# Patient Record
Sex: Female | Born: 1990 | Race: White | Hispanic: No | Marital: Single | Smoking: Never smoker
Health system: Southern US, Community
[De-identification: ages and names within clinical notes are randomized; demographics above are authoritative.]

## PROBLEM LIST (undated history)

## (undated) DIAGNOSIS — R011 Cardiac murmur, unspecified: Secondary | ICD-10-CM

## (undated) DIAGNOSIS — R112 Nausea with vomiting, unspecified: Secondary | ICD-10-CM

## (undated) DIAGNOSIS — Z889 Allergy status to unspecified drugs, medicaments and biological substances status: Secondary | ICD-10-CM

## (undated) DIAGNOSIS — E039 Hypothyroidism, unspecified: Secondary | ICD-10-CM

## (undated) DIAGNOSIS — K8020 Calculus of gallbladder without cholecystitis without obstruction: Secondary | ICD-10-CM

## (undated) DIAGNOSIS — E079 Disorder of thyroid, unspecified: Secondary | ICD-10-CM

## (undated) DIAGNOSIS — Z9889 Other specified postprocedural states: Secondary | ICD-10-CM

## (undated) HISTORY — DX: Cardiac murmur, unspecified: R01.1

## (undated) HISTORY — PX: SHOULDER SURGERY: SHX246

## (undated) HISTORY — PX: WISDOM TOOTH EXTRACTION: SHX21

## (undated) HISTORY — DX: Disorder of thyroid, unspecified: E07.9

## (undated) HISTORY — PX: TONSILLECTOMY: SUR1361

---

## 2013-07-26 ENCOUNTER — Ambulatory Visit (INDEPENDENT_AMBULATORY_CARE_PROVIDER_SITE_OTHER): Payer: BC Managed Care – PPO | Admitting: Family Medicine

## 2013-07-26 ENCOUNTER — Ambulatory Visit: Payer: BC Managed Care – PPO

## 2013-07-26 VITALS — BP 122/80 | HR 75 | Temp 98.7°F | Resp 16 | Ht 68.0 in | Wt 264.0 lb

## 2013-07-26 DIAGNOSIS — M25542 Pain in joints of left hand: Secondary | ICD-10-CM

## 2013-07-26 DIAGNOSIS — M25541 Pain in joints of right hand: Secondary | ICD-10-CM

## 2013-07-26 DIAGNOSIS — M25549 Pain in joints of unspecified hand: Secondary | ICD-10-CM

## 2013-07-26 DIAGNOSIS — M25579 Pain in unspecified ankle and joints of unspecified foot: Secondary | ICD-10-CM

## 2013-07-26 DIAGNOSIS — M25572 Pain in left ankle and joints of left foot: Secondary | ICD-10-CM

## 2013-07-26 LAB — POCT SEDIMENTATION RATE: POCT SED RATE: 37 mm/hr — AB (ref 0–22)

## 2013-07-26 LAB — C-REACTIVE PROTEIN: CRP: 0.8 mg/dL — ABNORMAL HIGH (ref <0.60)

## 2013-07-26 LAB — RHEUMATOID FACTOR: Rhuematoid fact SerPl-aCnc: <10 IU/mL (ref <=14)

## 2013-07-26 NOTE — Patient Instructions (Signed)
Your x-rays look fine.  I will be in touch with your blood work as soon as it comes in.

## 2013-07-26 NOTE — Progress Notes (Signed)
[facility] [address], [place] [address] [phone]  Date:  07/26/2013   Name:  Michaela Rogers   DOB:  May 04, 1991   MRN:  [id number]  PCP:  No PCP Per Patient    Chief Complaint: Joint Pain   History of Present Illness:  Michaela Rogers is a 22 y.o. very pleasant female patient who presents with the following:  She has had joint pains for some time. She seemed to have pains from her pre- teen to teen years and still today.   Her chiropractor and her ortho (Dr. Chancy Milroy) suggested that she have BW to screen for RA.  She has been dx with hypothyroidism in the past.   Her mother has Hashimotos' thyroididis, and a great grandmother had RA.  Thyroid issues, DM, and lupus run in her family.   She notes pain in her fingers, wrists, and ankles.  Her ankles swell at times.  Her knees can hurt.   She did have a shoulder operation (right) in April of this year.   She has an endocrinologist who checked her TSH recently.   LMP was recent.    There are no active problems to display for this patient.   Past Medical History  Diagnosis Date  . Heart murmur   . Thyroid disease     History reviewed. No pertinent past surgical history.  History  Substance Use Topics  . Smoking status: Never Smoker   . Smokeless tobacco: Not on file  . Alcohol Use: No     Comment: Social    Family History  Problem Relation Age of Onset  . Diabetes Mother   . Hyperlipidemia Father   . Mental illness Brother   . Hypertension Maternal Grandmother   . Stroke Maternal Grandmother   . Diabetes Paternal Grandmother     Allergies  Allergen Reactions  . Codeine Nausea Only    Bad stomach pain    Medication list has been reviewed and updated.  No current outpatient prescriptions on file prior to visit.   No current facility-administered medications on file prior to visit.    Review of Systems:  As per HPI- otherwise negative.  Physical  Examination: Filed Vitals:   07/26/13 1003  BP: 122/80  Pulse: 75  Temp: 98.7 F (37.1 C)  Resp: 16   Filed Vitals:   07/26/13 1003  Height: 5\' 8"  (1.727 m)  Weight: 264 lb (119.75 kg)   Body mass index is 40.15 kg/(m^2). Ideal Body Weight: Weight in (lb) to have BMI = 25: 164.1  GEN: WDWN, NAD, Non-toxic, A & O x 3, obese HEENT: Atraumatic, Normocephalic. Neck supple. No masses, No LAD. Ears and Nose: No external deformity. CV: RRR, No M/G/R. No JVD. No thrill. No extra heart sounds. PULM: CTA B, no wheezes, crackles, rhonchi. No retractions. No resp. distress. No accessory muscle use. ABD: S, NT, ND, +BS. No rebound. No HSM. EXTR: No c/c/e NEURO Normal gait.  PSYCH: Normally interactive. Conversant. Not depressed or anxious appearing.  Calm demeanor.  No apparent nodules or swelling in joints of bilateral hands/ wrists.  Normal ROM of these joints   [facility] reading (PRIMARY) by  Dr. Patsy Lager. Right hand: negative Left hand: negative  Results for orders placed in visit on 07/26/13  POCT SEDIMENTATION RATE      Result Value Range   POCT SED RATE 37 (*) 0 - 22 mm/hr    Assessment and Plan: Joint pain in fingers  of left hand - Plan: DG Hand 2 View Left, DG Hand 2 View Right, POCT SEDIMENTATION RATE, C-reactive protein, Rheumatoid factor, Cyclic Citrul Peptide Antibody, IGG  Pain in joint, ankle and foot, left  Joint pain in fingers of right hand - Plan: DG Hand 2 View Left, DG Hand 2 View Right, POCT SEDIMENTATION RATE, C-reactive protein, Rheumatoid factor, Cyclic Citrul Peptide Antibody, IGG  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  Await other labs.    Signed Abbe Amsterdam, MD

## 2013-07-27 LAB — CYCLIC CITRUL PEPTIDE ANTIBODY, IGG: Cyclic Citrullin Peptide Ab: <2 U/mL (ref 0.0–5.0)

## 2013-07-29 ENCOUNTER — Encounter: Payer: Self-pay | Admitting: Family Medicine

## 2013-11-17 ENCOUNTER — Encounter (HOSPITAL_COMMUNITY): Payer: Self-pay | Admitting: Emergency Medicine

## 2013-11-17 ENCOUNTER — Emergency Department (HOSPITAL_COMMUNITY)
Admission: EM | Admit: 2013-11-17 | Discharge: 2013-11-18 | Disposition: A | Discharge status: Home / Self Care | Payer: BC Managed Care – PPO | Attending: Emergency Medicine | Admitting: Emergency Medicine

## 2013-11-17 DIAGNOSIS — E079 Disorder of thyroid, unspecified: Secondary | ICD-10-CM | POA: Insufficient documentation

## 2013-11-17 DIAGNOSIS — R112 Nausea with vomiting, unspecified: Secondary | ICD-10-CM | POA: Insufficient documentation

## 2013-11-17 DIAGNOSIS — Z3202 Encounter for pregnancy test, result negative: Secondary | ICD-10-CM | POA: Insufficient documentation

## 2013-11-17 DIAGNOSIS — R630 Anorexia: Secondary | ICD-10-CM | POA: Insufficient documentation

## 2013-11-17 DIAGNOSIS — R011 Cardiac murmur, unspecified: Secondary | ICD-10-CM | POA: Insufficient documentation

## 2013-11-17 DIAGNOSIS — K8020 Calculus of gallbladder without cholecystitis without obstruction: Secondary | ICD-10-CM

## 2013-11-17 DIAGNOSIS — R1084 Generalized abdominal pain: Secondary | ICD-10-CM | POA: Insufficient documentation

## 2013-11-17 DIAGNOSIS — Z79899 Other long term (current) drug therapy: Secondary | ICD-10-CM | POA: Insufficient documentation

## 2013-11-17 LAB — URINALYSIS, ROUTINE W REFLEX MICROSCOPIC
Bilirubin Urine: NEGATIVE
Glucose, UA: NEGATIVE mg/dL
Hgb urine dipstick: NEGATIVE
Ketones, ur: NEGATIVE mg/dL
NITRITE: NEGATIVE
Protein, ur: NEGATIVE mg/dL
SPECIFIC GRAVITY, URINE: 1.022 (ref 1.005–1.030)
UROBILINOGEN UA: 0.2 mg/dL (ref 0.0–1.0)
pH: 5.5 (ref 5.0–8.0)

## 2013-11-17 LAB — COMPREHENSIVE METABOLIC PANEL
ALT: 38 U/L — ABNORMAL HIGH (ref 0–35)
AST: 23 U/L (ref 0–37)
Albumin: 4.3 g/dL (ref 3.5–5.2)
Alkaline Phosphatase: 66 U/L (ref 39–117)
BILIRUBIN TOTAL: 1 mg/dL (ref 0.3–1.2)
BUN: 15 mg/dL (ref 6–23)
CHLORIDE: 103 meq/L (ref 96–112)
CO2: 24 mEq/L (ref 19–32)
CREATININE: 0.89 mg/dL (ref 0.50–1.10)
Calcium: 9.6 mg/dL (ref 8.4–10.5)
GFR calc Af Amer: >90 mL/min (ref >90)
GFR calc non Af Amer: 90 mL/min — ABNORMAL LOW (ref >90)
Glucose, Bld: 96 mg/dL (ref 70–99)
Potassium: 3.7 mEq/L (ref 3.7–5.3)
Sodium: 140 mEq/L (ref 137–147)
Total Protein: 7.8 g/dL (ref 6.0–8.3)

## 2013-11-17 LAB — CBC WITH DIFFERENTIAL/PLATELET
BASOS ABS: 0 10*3/uL (ref 0.0–0.1)
BASOS PCT: 0 % (ref 0–1)
Eosinophils Absolute: 0.1 10*3/uL (ref 0.0–0.7)
Eosinophils Relative: 1 % (ref 0–5)
HEMATOCRIT: 44.5 % (ref 36.0–46.0)
Hemoglobin: 15.4 g/dL — ABNORMAL HIGH (ref 12.0–15.0)
Lymphocytes Relative: 13 % (ref 12–46)
Lymphs Abs: 1.4 10*3/uL (ref 0.7–4.0)
MCH: 31.4 pg (ref 26.0–34.0)
MCHC: 34.6 g/dL (ref 30.0–36.0)
MCV: 90.6 fL (ref 78.0–100.0)
MONO ABS: 0.4 10*3/uL (ref 0.1–1.0)
Monocytes Relative: 4 % (ref 3–12)
NEUTROS ABS: 8.6 10*3/uL — AB (ref 1.7–7.7)
Neutrophils Relative %: 83 % — ABNORMAL HIGH (ref 43–77)
Platelets: 219 10*3/uL (ref 150–400)
RBC: 4.91 MIL/uL (ref 3.87–5.11)
RDW: 12.1 % (ref 11.5–15.5)
WBC: 10.4 10*3/uL (ref 4.0–10.5)

## 2013-11-17 LAB — URINE MICROSCOPIC-ADD ON

## 2013-11-17 LAB — POC URINE PREG, ED: PREG TEST UR: NEGATIVE

## 2013-11-17 LAB — LIPASE, BLOOD: Lipase: 30 U/L (ref 11–59)

## 2013-11-17 MED ORDER — ONDANSETRON HCL 4 MG/2ML IJ SOLN
4.0000 mg | Freq: Once | INTRAMUSCULAR | Status: AC
  Administered 2013-11-17: 4 mg via INTRAVENOUS
  Filled 2013-11-17: qty 2

## 2013-11-17 MED ORDER — PROMETHAZINE HCL 25 MG/ML IJ SOLN
25.0000 mg | Freq: Once | INTRAMUSCULAR | Status: AC
  Administered 2013-11-17: 25 mg via INTRAVENOUS
  Filled 2013-11-17: qty 1

## 2013-11-17 MED ORDER — SODIUM CHLORIDE 0.9 % IV BOLUS (SEPSIS)
1000.0000 mL | INTRAVENOUS | Status: AC
  Administered 2013-11-17: 1000 mL via INTRAVENOUS

## 2013-11-17 MED ORDER — GI COCKTAIL ~~LOC~~
30.0000 mL | Freq: Once | ORAL | Status: AC
  Administered 2013-11-17: 30 mL via ORAL
  Filled 2013-11-17: qty 30

## 2013-11-17 MED ORDER — DICYCLOMINE HCL 10 MG/ML IM SOLN
20.0000 mg | Freq: Once | INTRAMUSCULAR | Status: AC
  Administered 2013-11-17: 20 mg via INTRAMUSCULAR
  Filled 2013-11-17: qty 2

## 2013-11-17 NOTE — Progress Notes (Signed)
   CARE MANAGEMENT ED NOTE 11/17/2013  Patient:  Salem SenateMCREYNOLDS,Grisela A   Account Number:  [id number]  Date Initiated:  11/17/2013  Documentation initiated by:  Radford PaxFERRERO,Ernest Popowski  Subjective/Objective Assessment:   Patient presents to ED with right upper abominal pain.     Subjective/Objective Assessment Detail:     Action/Plan:   Action/Plan Detail:   Anticipated DC Date:       Status Recommendation to Physician:   Result of Recommendation:    Other ED Services  Consult Working Plan    DC Planning Services  Other  PCP issues    Choice offered to / List presented to:            Status of service:  Completed, signed off  ED Comments:   ED Comments Detail:  EDCM spoke to patient at bedside.  As per patient, she does not have a pcp but she does have a endocronologist and [place]  Patient is planning on moving to [place] if she is hired for a position.  EDCM instructed patient to call the phone number on the back of her insurance card or go to insurance company website to help her find a pcp who is close to her and within network. Patient verbalized understanding.  No further EDCM needs at this time.

## 2013-11-17 NOTE — ED Notes (Signed)
Per pt report: pt c/o abd pain in the upper abd that is diffuse and sharp in nature.  Pain began around 18:30 this evening. Pain comes and goes.  Pt reports vomiting x 1.  Pt also reports dizziness. Pt a/o x 4.  Skin warm and dry. Ambulatory in triage.

## 2013-11-17 NOTE — ED Provider Notes (Signed)
CSN: [id number]     Arrival date & time 11/17/13  2050 History   First MD Initiated Contact with Patient 11/17/13 2059     Chief Complaint  Patient presents with  . Abdominal Pain  . Nausea     (Consider location/radiation/quality/duration/timing/severity/associated sxs/prior Treatment) HPI Pt is a 23yo female presenting to ED c/o upper abdominal pain that is diffuse and sharp in nature, 4/10 associated with nausea and 1 episode of non-bloody, non-bilious emesis.   Symptoms started around 18:30 this evening.  Denies fever, diarrhea, urinary, or vaginal symptoms. Pt does states she had a friend from home come visit but does not think that friend was sick.  Denies recent travel. Denies hx of abdominal surgery.  Pt does state symptoms are similar to last time she had a stomach virus.    Past Medical History  Diagnosis Date  . Heart murmur   . Thyroid disease    Past Surgical History  Procedure Laterality Date  . Tonsillectomy    . Wisdom tooth extraction    . Shoulder surgery     Family History  Problem Relation Age of Onset  . Diabetes Mother   . Hyperlipidemia Father   . Mental illness Brother   . Hypertension Maternal Grandmother   . Stroke Maternal Grandmother   . Diabetes Paternal Grandmother    History  Substance Use Topics  . Smoking status: Never Smoker   . Smokeless tobacco: Not on file  . Alcohol Use: No     Comment: Social   OB History   Grav Para Term Preterm Abortions TAB SAB Ect Mult Living                 Review of Systems  Constitutional: Positive for appetite change. Negative for fever, chills, diaphoresis and fatigue.  HENT: Negative for sore throat.   Respiratory: Negative for cough and shortness of breath.   Cardiovascular: Negative for chest pain.  Gastrointestinal: Positive for nausea, vomiting and abdominal pain ( upper ). Negative for diarrhea, constipation and blood in stool.  Genitourinary: Negative for dysuria, urgency, hematuria, flank pain,  decreased urine volume, vaginal bleeding, vaginal discharge, vaginal pain and pelvic pain.  Musculoskeletal: Negative for back pain.  All other systems reviewed and are negative.      Allergies  Codeine  Home Medications   Current Outpatient Rx  Name  Route  Sig  Dispense  Refill  . levothyroxine (SYNTHROID, LEVOTHROID) 75 MCG tablet   Oral   Take 75 mcg by mouth daily before breakfast.         . naproxen (NAPROSYN) 500 MG tablet   Oral   Take 500 mg by mouth 2 (two) times daily as needed (pain).         . Norethin Ace-Eth Estrad-FE (MINASTRIN 24 FE PO)   Oral   Take 1 tablet by mouth daily.         . promethazine (PHENERGAN) 25 MG tablet   Oral   Take 1 tablet (25 mg total) by mouth every 6 (six) hours as needed for nausea or vomiting.   12 tablet   0    BP 152/95  Pulse 109  Temp(Src) 98.6 F (37 C) (Oral)  Resp 20  SpO2 100%  LMP 11/10/2013 Physical Exam  Nursing note and vitals reviewed. Constitutional: She appears well-developed and well-nourished. No distress.  HENT:  Head: Normocephalic and atraumatic.  Eyes: Conjunctivae are normal. No scleral icterus.  Neck: Normal range of motion.  Cardiovascular:  Normal rate, regular rhythm and normal heart sounds.   Pulmonary/Chest: Effort normal and breath sounds normal. No respiratory distress. She has no wheezes. She has no rales. She exhibits no tenderness.  Abdominal: Soft. Bowel sounds are normal. She exhibits no distension and no mass. There is tenderness. There is no rebound and no guarding.  Soft, non-distended, tenderness in epigastrium without rebound, guarding, or masses.   Musculoskeletal: Normal range of motion.  Neurological: She is alert.  Skin: Skin is warm and dry. She is not diaphoretic.    ED Course  Procedures (including critical care time) Labs Review Labs Reviewed  CBC WITH DIFFERENTIAL - Abnormal; Notable for the following:    Hemoglobin 15.4 (*)    Neutrophils Relative % 83 (*)     Neutro Abs 8.6 (*)    All other components within normal limits  COMPREHENSIVE METABOLIC PANEL - Abnormal; Notable for the following:    ALT 38 (*)    GFR calc non Af Amer 90 (*)    All other components within normal limits  URINALYSIS, ROUTINE W REFLEX MICROSCOPIC - Abnormal; Notable for the following:    Leukocytes, UA TRACE (*)    All other components within normal limits  URINE MICROSCOPIC-ADD ON - Abnormal; Notable for the following:    Squamous Epithelial / LPF FEW (*)    All other components within normal limits  LIPASE, BLOOD  POC URINE PREG, ED   Imaging Review No results found.   EKG Interpretation None      MDM   Final diagnoses:  Nausea & vomiting    Pt is a 23yo female presenting with upper abdominal pain, nausea and vomiting. Appears well, non-toxic, afebrile. Appears well hydrated. Abd-soft, non-distended, tender in epigastrium, no rebound, guarding or masses.  Low concern for cholecystitis or other emergent process taking place.  Labs: CBC, CMP, and Lipase: unremarkable UA: unremarkable.  Tx in ED: fluids and zofran. Pt still c/o nausea and mild abdominal pain but declined IV pain medication. Will give phenergan and GI cocktail, then reassess and attempt fluid challenge.   10:48 PM  Shortly after given GI cocktail, pt had 1 large episode of emesis.  Will allow IV fluids to continue and given phenergan more time to work then reassess.   12:14 AM Pt was given another dose of zofran and some bentyl around 11:45PM. Pt has been able to keep down several ounces of water since then.  States she is feeling more fatigued.  Abd-soft non-distended, mild tenderness in epigastrium still present. Pt stated she felt more nauseated than sore.   Will discharge pt home, do not believe further workup or imaging needed at this time. Rx: phenergan.  Return precautions provided. Pt verbalized understanding and agreement with tx plan.  Discussed pt with attending who agrees with  tx plan.      Junius FinnerErin O'Malley, PA-C 11/18/13 [phone]

## 2013-11-18 ENCOUNTER — Emergency Department (HOSPITAL_COMMUNITY): Payer: BC Managed Care – PPO

## 2013-11-18 MED ORDER — ACETAMINOPHEN 325 MG PO TABS
650.0000 mg | ORAL_TABLET | Freq: Once | ORAL | Status: AC
  Administered 2013-11-18: 650 mg via ORAL
  Filled 2013-11-18: qty 2

## 2013-11-18 MED ORDER — PROMETHAZINE HCL 25 MG PO TABS: 25.0000 mg | ORAL_TABLET | Freq: Four times a day (QID) | ORAL | Status: AC | PRN

## 2013-11-18 NOTE — ED Notes (Signed)
Pt is resting comfortably in her room at this time. Pt is unable to drive r/t recent medication given. Pt will be leaving in a few hours after she is able to drive. Charge RN aware.

## 2013-11-18 NOTE — Discharge Instructions (Signed)
Cholelithiasis °Cholelithiasis (also called gallstones) is a form of gallbladder disease in which gallstones form in your gallbladder. The gallbladder is an organ that stores bile made in the liver, which helps digest fats. Gallstones begin as small crystals and slowly grow into stones. Gallstone pain occurs when the gallbladder spasms and a gallstone is blocking the duct. Pain can also occur when a stone passes out of the duct.  °RISK FACTORS °· Being female.   °· Having multiple pregnancies. Health care providers sometimes advise removing diseased gallbladders before future pregnancies.   °· Being obese. °· Eating a diet heavy in fried foods and fat.   °· Being older than 60 years and increasing age.   °· Prolonged use of medicines containing female hormones.   °· Having diabetes mellitus.   °· Rapidly losing weight.   °· Having a family history of gallstones (heredity).   °SYMPTOMS °· Nausea.   °· Vomiting. °· Abdominal pain.   °· Yellowing of the skin (jaundice).   °· Sudden pain. It may persist from several minutes to several hours. °· Fever.   °· Tenderness to the touch.  °In some cases, when gallstones do not move into the bile duct, people have no pain or symptoms. These are called "silent" gallstones.  °TREATMENT °Silent gallstones do not need treatment. In severe cases, emergency surgery may be required. Options for treatment include: °· Surgery to remove the gallbladder. This is the most common treatment. °· Medicines. These do not always work and may take 6 12 months or more to work. °· Shock wave treatment (extracorporeal biliary lithotripsy). In this treatment an ultrasound machine sends shock waves to the gallbladder to break gallstones into smaller pieces that can pass into the intestines or be dissolved by medicine. °HOME CARE INSTRUCTIONS  °· Only take over-the-counter or prescription medicines for pain, discomfort, or fever as directed by your health care provider.   °· Follow a low-fat diet until  seen again by your health care provider. Fat causes the gallbladder to contract, which can result in pain.   °· Follow up with your health care provider as directed. Attacks are almost always recurrent and surgery is usually required for permanent treatment.   °SEEK IMMEDIATE MEDICAL CARE IF:  °· Your pain increases and is not controlled by medicines.   °· You have a fever or persistent symptoms for more than 2 3 days.   °· You have a fever and your symptoms suddenly get worse.   °· You have persistent nausea and vomiting.   °MAKE SURE YOU:  °· Understand these instructions. °· Will watch your condition. °· Will get help right away if you are not doing well or get worse. °Document Released: 08/01/2005 Document Revised: 04/07/2013 Document Reviewed: 01/27/2013 °ExitCare® Patient Information ©2014 [employer]. ° °

## 2013-11-29 NOTE — ED Provider Notes (Signed)
Medical screening examination/treatment/procedure(s) were conducted as a shared visit with non-physician practitioner(s) and myself.  I personally evaluated the patient during the encounter.   EKG Interpretation None      Pt with upper abd pain and nv.  abd soft nt. Ivf, zofran.    Suzi RootsKevin E Wandy Bossler, MD 11/29/13 [phone]

## 2013-12-08 ENCOUNTER — Ambulatory Visit (INDEPENDENT_AMBULATORY_CARE_PROVIDER_SITE_OTHER): Payer: BC Managed Care – PPO | Admitting: General Surgery

## 2013-12-08 ENCOUNTER — Encounter (INDEPENDENT_AMBULATORY_CARE_PROVIDER_SITE_OTHER): Payer: Self-pay | Admitting: General Surgery

## 2013-12-08 VITALS — BP 118/78 | HR 74 | Temp 98.2°F | Resp 16 | Ht 68.0 in | Wt 240.0 lb

## 2013-12-08 DIAGNOSIS — K8020 Calculus of gallbladder without cholecystitis without obstruction: Secondary | ICD-10-CM | POA: Insufficient documentation

## 2013-12-08 NOTE — Patient Instructions (Signed)
Strict lowfat to nonfat diet.   [facility] ______[facility] LAP[facility] SUR[facility]ERY: POST OP INSTRUCTIONS Always review your discharge instruction sheet given to you by the facility where your surgery was performed. IF YOU HAVE DISABILITY OR FAMILY LEAVE FORMS, YOU MUST BRING THEM TO THE OFFICE FOR PROCESSING.   DO NOT GIVE THEM TO YOUR DOCTOR.  1. A prescription for pain medication may be given to you upon discharge.  Take your pain medication as prescribed, if needed.  If narcotic pain medicine is not needed, then you may take acetaminophen (Tylenol) or ibuprofen (Advil) as needed. 2. Take your usually prescribed medications unless otherwise directed. 3. If you need a refill on your pain medication, please contact your pharmacy.  They will contact our office to request authorization. Prescriptions will not be filled after 5pm or on week-ends. 4. You should follow a light diet the first few days after arrival home, such as soup and crackers, etc.  Be sure to include lots of fluids daily. 5. Most patients will experience some swelling and bruising in the area of the incisions.  Ice packs will help.  Swelling and bruising can take several days to resolve.  6. It is common to experience some constipation if taking pain medication after surgery.  Increasing fluid intake and taking a stool softener (such as Colace) will usually help or prevent this problem from occurring.  A mild laxative (Milk of Magnesia or Miralax) should be taken according to package instructions if there are no bowel movements after 48 hours. 7. Unless discharge instructions indicate otherwise, you may remove your bandages 24-48 hours after surgery, and you may shower at that time.  You may have steri-strips (small skin tapes) in place directly over the incision.  These strips should be left on the skin for 7-10 days.  If your surgeon used skin glue on the incision, you may shower in 24 hours.  The glue will flake off over  the next 2-3 weeks.  Any sutures or staples will be removed at the office during your follow-up visit. 8. ACTIVITIES:  You may resume regular (light) daily activities beginning the next day-such as daily self-care, walking, climbing stairs-gradually increasing activities as tolerated.  You may have sexual intercourse when it is comfortable.  Refrain from any heavy lifting or straining until approved by your doctor. a. You may drive when you are no longer taking prescription pain medication, you can comfortably wear a seatbelt, and you can safely maneuver your car and apply brakes. b. RETURN TO WORK:  __________________________________________________________ 9. You should see your doctor in the office for a follow-up appointment approximately 2-3 weeks after your surgery.  Make sure that you call for this appointment within a day or two after you arrive home to insure a convenient appointment time. 10. OTHER INSTRUCTIONS: __________________________________________________________________________________________________________________________ __________________________________________________________________________________________________________________________ WHEN TO CALL YOUR DOCTOR: 1. Fever over 101.0 2. Inability to urinate 3. Continued bleeding from incision. 4. Increased pain, redness, or drainage from the incision. 5. Increasing abdominal pain  The clinic staff is available to answer your questions during regular business hours.  Please don't hesitate to call and ask to speak to one of the nurses for clinical concerns.  If you have a medical emergency, go to the nearest emergency room or call 911.  A surgeon from [facility] is always on call at the hospital. [address], [address], [place], [place]  [address] ? [address] [address], [place],    [address] [phone] ?   [phone] ? FAX [phone] Web site: [email or web address] 

## 2013-12-08 NOTE — Progress Notes (Signed)
Patient ID: Michaela SenateBrittany Stacey, female   DOB: Jun 12, 1991, 23 y.o.   MRN: [id number]  Chief Complaint  Patient presents with  . New Evaluation    eval GB    HPI Michaela Rogers is a 23 y.o. female.   HPI  She is referred by Dr. Denton LankSteinl from the emergency department.  She was evaluated and had an ultrasound that showed cholelithiasis but no evidence of acute cholecystitis. The common bile duct diameter was normal.  One of her transaminases were slightly elevated. Her white blood cell count was normal. She was treated in a B. Release. She's had a few minor episodes since then. She's been trying to watch her diet. She has lost 40 pounds, purposefully, over the past 6 months. There is a family history of gallbladder disease.  Past Medical History  Diagnosis Date  . Heart murmur   . Thyroid disease     Past Surgical History  Procedure Laterality Date  . Tonsillectomy    . Wisdom tooth extraction    . Shoulder surgery      Family History  Problem Relation Age of Onset  . Diabetes Mother   . Hyperlipidemia Father   . Mental illness Brother   . Hypertension Maternal Grandmother   . Stroke Maternal Grandmother   . Diabetes Paternal Grandmother     Social History History  Substance Use Topics  . Smoking status: Never Smoker   . Smokeless tobacco: Not on file  . Alcohol Use: No     Comment: Social    Allergies  Allergen Reactions  . Codeine Nausea Only    Bad stomach pain    Current Outpatient Prescriptions  Medication Sig Dispense Refill  . levothyroxine (SYNTHROID, LEVOTHROID) 75 MCG tablet Take 75 mcg by mouth daily before breakfast.      . naproxen (NAPROSYN) 500 MG tablet Take 500 mg by mouth 2 (two) times daily as needed (pain).      . Norethin Ace-Eth Estrad-FE (MINASTRIN 24 FE PO) Take 1 tablet by mouth daily.      . promethazine (PHENERGAN) 25 MG tablet Take 1 tablet (25 mg total) by mouth every 6 (six) hours as needed for nausea or vomiting.  12 tablet  0   No  current facility-administered medications for this visit.    Review of Systems Review of Systems  Constitutional: Positive for fever.  Cardiovascular: Positive for chest pain and leg swelling.  Gastrointestinal: Positive for nausea, vomiting, abdominal pain and abdominal distention.  Neurological: Positive for headaches.  Hematological: Bruises/bleeds easily.    Blood pressure 118/78, pulse 74, temperature 98.2 F (36.8 C), temperature source Temporal, resp. rate 16, height 5\' 8"  (1.727 m), weight 240 lb (108.863 kg), last menstrual period 11/10/2013.  Physical Exam Physical Exam  Constitutional: No distress.  Overweight female in no acute distress  HENT:  Head: Normocephalic and atraumatic.  Eyes: EOM are normal. No scleral icterus.  Cardiovascular: Normal rate and regular rhythm.   Pulmonary/Chest: Effort normal and breath sounds normal.  Abdominal: She exhibits no distension and no mass. There is no tenderness.  Musculoskeletal: She exhibits no edema.  Neurological: She is alert.  Skin: Skin is warm and dry.  Psychiatric: She has a normal mood and affect.    Data Reviewed Laboratory reports, ultrasound report, emergency department visit note  Assessment    Symptomatic cholelithiasis. She's been having some smaller episodes which resolved spontaneously.     Plan    I recommend a laparoscopic cholecystectomy. She is in  agreement with this. We discussed also a strict nonfat to very low-fat diet.  I have explained the procedure, risks, and aftercare of cholecystectomy.  Risks include but are not limited to bleeding, infection, wound problems, anesthesia, diarrhea, bile leak, injury to common bile duct/liver/intestine.  She seems to understand and agrees to proceed.         Jim Desanctisodd J Asma Boldon 12/08/2013, 9:56 AM

## 2013-12-15 ENCOUNTER — Encounter (HOSPITAL_COMMUNITY): Payer: Self-pay | Admitting: Pharmacy Technician

## 2013-12-22 ENCOUNTER — Encounter (HOSPITAL_COMMUNITY): Payer: Self-pay

## 2013-12-22 ENCOUNTER — Encounter (HOSPITAL_COMMUNITY)
Admission: RE | Admit: 2013-12-22 | Discharge: 2013-12-22 | Disposition: A | Discharge status: Home / Self Care | Payer: BC Managed Care – PPO | Source: Ambulatory Visit | Attending: General Surgery | Admitting: General Surgery

## 2013-12-22 DIAGNOSIS — Z01812 Encounter for preprocedural laboratory examination: Secondary | ICD-10-CM | POA: Insufficient documentation

## 2013-12-22 HISTORY — DX: Allergy status to unspecified drugs, medicaments and biological substances: Z88.9

## 2013-12-22 HISTORY — DX: Calculus of gallbladder without cholecystitis without obstruction: K80.20

## 2013-12-22 HISTORY — DX: Hypothyroidism, unspecified: E03.9

## 2013-12-22 HISTORY — DX: Other specified postprocedural states: Z98.890

## 2013-12-22 HISTORY — DX: Nausea with vomiting, unspecified: R11.2

## 2013-12-22 HISTORY — DX: Other specified postprocedural states: R11.2

## 2013-12-22 LAB — HCG, SERUM, QUALITATIVE: Preg, Serum: NEGATIVE

## 2013-12-22 LAB — COMPREHENSIVE METABOLIC PANEL
ALBUMIN: 3.8 g/dL (ref 3.5–5.2)
ALK PHOS: 77 U/L (ref 39–117)
ALT: 34 U/L (ref 0–35)
AST: 28 U/L (ref 0–37)
BUN: 10 mg/dL (ref 6–23)
CHLORIDE: 101 meq/L (ref 96–112)
CO2: 26 mEq/L (ref 19–32)
Calcium: 9.1 mg/dL (ref 8.4–10.5)
Creatinine, Ser: 0.8 mg/dL (ref 0.50–1.10)
GFR calc non Af Amer: >90 mL/min (ref >90)
Glucose, Bld: 89 mg/dL (ref 70–99)
POTASSIUM: 3.9 meq/L (ref 3.7–5.3)
SODIUM: 138 meq/L (ref 137–147)
TOTAL PROTEIN: 6.9 g/dL (ref 6.0–8.3)
Total Bilirubin: 0.9 mg/dL (ref 0.3–1.2)

## 2013-12-22 NOTE — Patient Instructions (Signed)
[address]  12/22/2013   Your procedure is scheduled on: 5-14  -2015  Report to [facility] at      1000  AM .  Call this number if you have problems the morning of surgery: [phone]  Or Presurgical Testing [phone](Charidy Cappelletti) For Living Will and/or Health Care Power Attorney Forms: please provide copy for your medical record,may bring AM of surgery(Forms should be already notarized -we do not provide this service).(12-22-13 preferred no further information today).      Do not eat food:After Midnight.    Take these medicines the morning of surgery with A SIP OF WATER: Levothyroxine.   Do not wear jewelry, make-up or nail polish.  Do not wear lotions, powders, or perfumes. You may wear deodorant.  Do not shave 48 hours(2 days) prior to first CHG shower(legs and under arms).(Shaving face and neck okay.)  Do not bring valuables to the hospital.(Hospital is not responsible for lost valuables).  Contacts, dentures or removable bridgework, body piercing, hair pins may not be worn into surgery.  Leave suitcase in the car. After surgery it may be brought to your room.  For patients admitted to the hospital, checkout time is 11:00 AM the day of discharge.(Restricted visitors-Any Persons displaying flu-like symptoms or illness).    Patients discharged the day of surgery will not be allowed to drive home. Must have responsible person with you x 24 hours once discharged.  Name and phone number of your driver: mother Ashok Norris-Edwina Lambert, [phone]   Special Instructions: CHG(Chlorhedine 4%-"Hibiclens","Betasept","Aplicare") Shower Use Special Wash: see special instructions.(avoid face and genitals)     Failure to follow these instructions may result in Cancellation of your surgery.   Patient signature_______________________________________________________

## 2013-12-30 ENCOUNTER — Encounter (HOSPITAL_COMMUNITY): Payer: Self-pay | Admitting: *Deleted

## 2013-12-30 ENCOUNTER — Encounter (HOSPITAL_COMMUNITY): Payer: BC Managed Care – PPO | Admitting: Registered Nurse

## 2013-12-30 ENCOUNTER — Ambulatory Visit (HOSPITAL_COMMUNITY): Payer: BC Managed Care – PPO | Admitting: Registered Nurse

## 2013-12-30 ENCOUNTER — Ambulatory Visit (HOSPITAL_COMMUNITY)
Admission: RE | Admit: 2013-12-30 | Discharge: 2013-12-30 | Disposition: A | Discharge status: Home / Self Care | Payer: BC Managed Care – PPO | Source: Ambulatory Visit | Attending: General Surgery | Admitting: General Surgery

## 2013-12-30 ENCOUNTER — Ambulatory Visit (HOSPITAL_COMMUNITY): Payer: BC Managed Care – PPO

## 2013-12-30 ENCOUNTER — Encounter (HOSPITAL_COMMUNITY)
Admission: RE | Disposition: A | Discharge status: Home / Self Care | Payer: Self-pay | Source: Ambulatory Visit | Attending: General Surgery

## 2013-12-30 DIAGNOSIS — E039 Hypothyroidism, unspecified: Secondary | ICD-10-CM | POA: Insufficient documentation

## 2013-12-30 DIAGNOSIS — Z79899 Other long term (current) drug therapy: Secondary | ICD-10-CM | POA: Insufficient documentation

## 2013-12-30 DIAGNOSIS — K8010 Calculus of gallbladder with chronic cholecystitis without obstruction: Secondary | ICD-10-CM | POA: Insufficient documentation

## 2013-12-30 HISTORY — PX: CHOLECYSTECTOMY: SHX55

## 2013-12-30 SURGERY — LAPAROSCOPIC CHOLECYSTECTOMY WITH INTRAOPERATIVE CHOLANGIOGRAM
Anesthesia: General | Site: Abdomen

## 2013-12-30 MED ORDER — DEXAMETHASONE SODIUM PHOSPHATE 10 MG/ML IJ SOLN
INTRAMUSCULAR | Status: AC
  Filled 2013-12-30: qty 1

## 2013-12-30 MED ORDER — HYDROMORPHONE HCL PF 2 MG/ML IJ SOLN
INTRAMUSCULAR | Status: AC
  Filled 2013-12-30: qty 1

## 2013-12-30 MED ORDER — MIDAZOLAM HCL 2 MG/2ML IJ SOLN
INTRAMUSCULAR | Status: AC
  Filled 2013-12-30: qty 2

## 2013-12-30 MED ORDER — CEFAZOLIN SODIUM-DEXTROSE 2-3 GM-% IV SOLR
INTRAVENOUS | Status: AC
  Filled 2013-12-30: qty 50

## 2013-12-30 MED ORDER — ONDANSETRON HCL 4 MG/2ML IJ SOLN
INTRAMUSCULAR | Status: AC
  Filled 2013-12-30: qty 2

## 2013-12-30 MED ORDER — CEFAZOLIN SODIUM-DEXTROSE 2-3 GM-% IV SOLR
2.0000 g | INTRAVENOUS | Status: AC
  Administered 2013-12-30: 2 g via INTRAVENOUS

## 2013-12-30 MED ORDER — LIDOCAINE HCL (CARDIAC) 20 MG/ML IV SOLN
INTRAVENOUS | Status: DC | PRN
  Administered 2013-12-30: 50 mg via INTRAVENOUS

## 2013-12-30 MED ORDER — PROPOFOL 10 MG/ML IV BOLUS
INTRAVENOUS | Status: DC | PRN
  Administered 2013-12-30: 200 mg via INTRAVENOUS

## 2013-12-30 MED ORDER — ONDANSETRON HCL 4 MG/2ML IJ SOLN: 4.0000 mg | INTRAMUSCULAR | Status: DC | PRN

## 2013-12-30 MED ORDER — FENTANYL CITRATE 0.05 MG/ML IJ SOLN
INTRAMUSCULAR | Status: AC
  Filled 2013-12-30: qty 5

## 2013-12-30 MED ORDER — SUCCINYLCHOLINE CHLORIDE 20 MG/ML IJ SOLN
INTRAMUSCULAR | Status: DC | PRN
  Administered 2013-12-30: 100 mg via INTRAVENOUS

## 2013-12-30 MED ORDER — NEOSTIGMINE METHYLSULFATE 10 MG/10ML IV SOLN
INTRAVENOUS | Status: DC | PRN
  Administered 2013-12-30: 4 mg via INTRAVENOUS

## 2013-12-30 MED ORDER — ROCURONIUM BROMIDE 100 MG/10ML IV SOLN
INTRAVENOUS | Status: DC | PRN
  Administered 2013-12-30: 35 mg via INTRAVENOUS
  Administered 2013-12-30: 5 mg via INTRAVENOUS

## 2013-12-30 MED ORDER — BUPIVACAINE HCL 0.5 % IJ SOLN
INTRAMUSCULAR | Status: DC | PRN
  Administered 2013-12-30: 17 mL

## 2013-12-30 MED ORDER — LACTATED RINGERS IV SOLN
INTRAVENOUS | Status: DC | PRN
  Administered 2013-12-30: 1000 mL

## 2013-12-30 MED ORDER — BUPIVACAINE HCL (PF) 0.5 % IJ SOLN
INTRAMUSCULAR | Status: AC
  Filled 2013-12-30: qty 30

## 2013-12-30 MED ORDER — 0.9 % SODIUM CHLORIDE (POUR BTL) OPTIME
TOPICAL | Status: DC | PRN
  Administered 2013-12-30: 1000 mL

## 2013-12-30 MED ORDER — MORPHINE SULFATE 10 MG/ML IJ SOLN: 2.0000 mg | INTRAMUSCULAR | Status: DC | PRN

## 2013-12-30 MED ORDER — FENTANYL CITRATE 0.05 MG/ML IJ SOLN
INTRAMUSCULAR | Status: AC
  Filled 2013-12-30: qty 2

## 2013-12-30 MED ORDER — DEXAMETHASONE SODIUM PHOSPHATE 10 MG/ML IJ SOLN
INTRAMUSCULAR | Status: DC | PRN
  Administered 2013-12-30: 10 mg via INTRAVENOUS

## 2013-12-30 MED ORDER — DEXTROSE IN LACTATED RINGERS 5 % IV SOLN: INTRAVENOUS | Status: DC

## 2013-12-30 MED ORDER — MEPERIDINE HCL 50 MG/ML IJ SOLN: 6.2500 mg | INTRAMUSCULAR | Status: DC | PRN

## 2013-12-30 MED ORDER — LIDOCAINE HCL (CARDIAC) 20 MG/ML IV SOLN
INTRAVENOUS | Status: AC
  Filled 2013-12-30: qty 5

## 2013-12-30 MED ORDER — GLYCOPYRROLATE 0.2 MG/ML IJ SOLN
INTRAMUSCULAR | Status: AC
  Filled 2013-12-30: qty 3

## 2013-12-30 MED ORDER — HYDROMORPHONE HCL PF 1 MG/ML IJ SOLN
INTRAMUSCULAR | Status: DC | PRN
  Administered 2013-12-30 (×2): 1 mg via INTRAVENOUS

## 2013-12-30 MED ORDER — LACTATED RINGERS IV SOLN: INTRAVENOUS | Status: DC

## 2013-12-30 MED ORDER — OXYCODONE HCL 5 MG PO TABS
5.0000 mg | ORAL_TABLET | ORAL | Status: DC | PRN
  Administered 2013-12-30: 5 mg via ORAL
  Filled 2013-12-30: qty 1

## 2013-12-30 MED ORDER — FENTANYL CITRATE 0.05 MG/ML IJ SOLN
25.0000 ug | INTRAMUSCULAR | Status: DC | PRN
  Administered 2013-12-30 (×4): 50 ug via INTRAVENOUS

## 2013-12-30 MED ORDER — ONDANSETRON HCL 4 MG/2ML IJ SOLN
INTRAMUSCULAR | Status: DC | PRN
  Administered 2013-12-30: 4 mg via INTRAVENOUS

## 2013-12-30 MED ORDER — GLYCOPYRROLATE 0.2 MG/ML IJ SOLN
INTRAMUSCULAR | Status: DC | PRN
  Administered 2013-12-30: 0.6 mg via INTRAVENOUS

## 2013-12-30 MED ORDER — MIDAZOLAM HCL 5 MG/5ML IJ SOLN
INTRAMUSCULAR | Status: DC | PRN
  Administered 2013-12-30: 2 mg via INTRAVENOUS

## 2013-12-30 MED ORDER — ACETAMINOPHEN 325 MG PO TABS: 650.0000 mg | ORAL_TABLET | ORAL | Status: DC | PRN

## 2013-12-30 MED ORDER — ROCURONIUM BROMIDE 100 MG/10ML IV SOLN
INTRAVENOUS | Status: AC
  Filled 2013-12-30: qty 1

## 2013-12-30 MED ORDER — LACTATED RINGERS IV SOLN
INTRAVENOUS | Status: DC | PRN
  Administered 2013-12-30 (×2): via INTRAVENOUS

## 2013-12-30 MED ORDER — ACETAMINOPHEN 650 MG RE SUPP
650.0000 mg | RECTAL | Status: DC | PRN
  Filled 2013-12-30: qty 1

## 2013-12-30 MED ORDER — PROMETHAZINE HCL 25 MG/ML IJ SOLN
6.2500 mg | INTRAMUSCULAR | Status: DC | PRN
  Administered 2013-12-30: 6.25 mg via INTRAVENOUS
  Filled 2013-12-30: qty 1

## 2013-12-30 MED ORDER — PROPOFOL 10 MG/ML IV BOLUS
INTRAVENOUS | Status: AC
  Filled 2013-12-30: qty 20

## 2013-12-30 MED ORDER — NEOSTIGMINE METHYLSULFATE 10 MG/10ML IV SOLN
INTRAVENOUS | Status: AC
  Filled 2013-12-30: qty 1

## 2013-12-30 MED ORDER — SODIUM CHLORIDE 0.9 % IJ SOLN: 3.0000 mL | INTRAMUSCULAR | Status: DC | PRN

## 2013-12-30 MED ORDER — IOHEXOL 300 MG/ML  SOLN
INTRAMUSCULAR | Status: DC | PRN
  Administered 2013-12-30: 5 mL

## 2013-12-30 MED ORDER — FENTANYL CITRATE 0.05 MG/ML IJ SOLN
INTRAMUSCULAR | Status: DC | PRN
  Administered 2013-12-30: 100 ug via INTRAVENOUS
  Administered 2013-12-30: 50 ug via INTRAVENOUS
  Administered 2013-12-30: 100 ug via INTRAVENOUS

## 2013-12-30 MED ORDER — OXYCODONE HCL 5 MG PO TABS: 5.0000 mg | ORAL_TABLET | ORAL | Status: AC | PRN

## 2013-12-30 SURGICAL SUPPLY — 38 items
APPLIER CLIP 5 13 M/L LIGAMAX5 (MISCELLANEOUS) ×3
BENZOIN TINCTURE PRP APPL 2/3 (GAUZE/BANDAGES/DRESSINGS) ×3 IMPLANT
CHLORAPREP W/TINT 26ML (MISCELLANEOUS) ×3 IMPLANT
CLIP APPLIE 5 13 M/L LIGAMAX5 (MISCELLANEOUS) ×1 IMPLANT
CLOSURE WOUND 1/2 X4 (GAUZE/BANDAGES/DRESSINGS) ×1
COVER MAYO STAND STRL (DRAPES) ×3 IMPLANT
DECANTER SPIKE VIAL GLASS SM (MISCELLANEOUS) IMPLANT
DISSECTOR BLUNT TIP ENDO 5MM (MISCELLANEOUS) IMPLANT
DRAPE C-ARM 42X120 X-RAY (DRAPES) ×3 IMPLANT
DRAPE LAPAROSCOPIC ABDOMINAL (DRAPES) ×3 IMPLANT
DRAPE UTILITY XL STRL (DRAPES) ×3 IMPLANT
DRSG TEGADERM 2-3/8X2-3/4 SM (GAUZE/BANDAGES/DRESSINGS) ×9 IMPLANT
ELECT REM PT RETURN 9FT ADLT (ELECTROSURGICAL) ×3
ELECTRODE REM PT RTRN 9FT ADLT (ELECTROSURGICAL) ×1 IMPLANT
ENDOLOOP SUT PDS II  0 18 (SUTURE)
ENDOLOOP SUT PDS II 0 18 (SUTURE) IMPLANT
GAUZE SPONGE 2X2 8PLY STRL LF (GAUZE/BANDAGES/DRESSINGS) ×1 IMPLANT
GLOVE ECLIPSE 8.0 STRL XLNG CF (GLOVE) ×3 IMPLANT
GLOVE INDICATOR 8.0 STRL GRN (GLOVE) ×3 IMPLANT
GOWN STRL REUS W/TWL XL LVL3 (GOWN DISPOSABLE) ×9 IMPLANT
HEMOSTAT SNOW SURGICEL 2X4 (HEMOSTASIS) ×3 IMPLANT
KIT BASIN OR (CUSTOM PROCEDURE TRAY) ×3 IMPLANT
POUCH SPECIMEN RETRIEVAL 10MM (ENDOMECHANICALS) ×3 IMPLANT
SCISSORS LAP 5X35 DISP (ENDOMECHANICALS) ×3 IMPLANT
SET CHOLANGIOGRAPH MIX (MISCELLANEOUS) ×3 IMPLANT
SET IRRIG TUBING LAPAROSCOPIC (IRRIGATION / IRRIGATOR) ×3 IMPLANT
SLEEVE XCEL OPT CAN 5 100 (ENDOMECHANICALS) ×6 IMPLANT
SOLUTION ANTI FOG 6CC (MISCELLANEOUS) ×3 IMPLANT
SPONGE GAUZE 2X2 STER 10/PKG (GAUZE/BANDAGES/DRESSINGS) ×2
STRIP CLOSURE SKIN 1/2X4 (GAUZE/BANDAGES/DRESSINGS) ×2 IMPLANT
SUT MNCRL AB 4-0 PS2 18 (SUTURE) ×3 IMPLANT
TOWEL OR 17X26 10 PK STRL BLUE (TOWEL DISPOSABLE) ×3 IMPLANT
TOWEL OR NON WOVEN STRL DISP B (DISPOSABLE) ×3 IMPLANT
TRAY LAP CHOLE (CUSTOM PROCEDURE TRAY) ×3 IMPLANT
TROCAR BLADELESS OPT 5 100 (ENDOMECHANICALS) ×3 IMPLANT
TROCAR XCEL BLUNT TIP 100MML (ENDOMECHANICALS) ×3 IMPLANT
TROCAR XCEL NON-BLD 11X100MML (ENDOMECHANICALS) IMPLANT
TUBING INSUFFLATION 10FT LAP (TUBING) ×3 IMPLANT

## 2013-12-30 NOTE — Interval H&P Note (Signed)
History and Physical Interval Note:  12/30/2013 12:01 PM  Michaela Rogers  has presented today for surgery, with the diagnosis of gallstones   The various methods of treatment have been discussed with the patient and family. After consideration of risks, benefits and other options for treatment, the patient has consented to  Procedure(s): LAPAROSCOPIC CHOLECYSTECTOMY WITH INTRAOPERATIVE CHOLANGIOGRAM (N/A) as a surgical intervention .  The patient's history has been reviewed, patient examined, no change in status, stable for surgery.  I have reviewed the patient's chart and labs.  Questions were answered to the patient's satisfaction.     Jim Desanctisodd J Georges Victorio

## 2013-12-30 NOTE — Interval H&P Note (Signed)
History and Physical Interval Note:  12/30/2013 11:54 AM  [place] Senate[place]ittany Rogers  has presented today for surgery, with the diagnosis of gallstones   The various methods of treatment have been discussed with the patient and family. After consideration of risks, benefits and other options for treatment, the patient has consented to  Procedure(s): LAPAROSCOPIC CHOLECYSTECTOMY WITH INTRAOPERATIVE CHOLANGIOGRAM (N/A) as a surgical intervention .  The patient's history has been reviewed, patient examined, no change in status, stable for surgery.  I have reviewed the patient's chart and labs.  Questions were answered to the patient's satisfaction.     Jim Desanctisodd J Skylur Fuston

## 2013-12-30 NOTE — Op Note (Signed)
Preoperative diagnosis:  Symptomatic cholelithiasis  Postoperative diagnosis:  Same  Procedure: Laparoscopic cholecystectomy with cholangiogram.  Surgeon: Avel Peaceodd Anastassia Noack, M.D.  Asst.:  Marcille BlancoMatt Tsuei, M.D.  Anesthesia: General  Indication:   This is a 23 year old female who is been having biliary colic type pain and has gallstones. She now presents for elective cholecystectomy.  Technique: She was brought to the operating room, placed supine on the operating table, and a general anesthetic was administered.  The abdominal wall was then sterilely prepped and draped. Local anesthetic (Marcaine) was infiltrated in the subumbilical region. A small subumbilical incision was made through the skin, subcutaneous tissue, fascia, and peritoneum entering the peritoneal cavity under direct vision. A pursestring suture of 0 Vicryl was placed around the edges of the fascia. A Hassan trocar was introduced into the peritoneal cavity and a pneumoperitoneum was created by insufflation of carbon dioxide gas. The laparoscope was introduced into the trocar and no underlying bleeding or organ injury was noted. She was then placed in the reverse Trendelenburg position with the right side tilted slightly up.  Three 5 mm trocars were then placed into the abdominal cavity under laparoscopic vision. One in the epigastric area, and 2 in the right upper quadrant area. The gallbladder was visualized and the fundus was grasped and retracted toward the right shoulder.  The infundibulum was mobilized with dissection close to the gallbladder and retracted laterally. The cystic duct was identified and a window was created around it. The cystic artery was also identified and a window was created around it. The critical view was achieved. A clip was placed at the neck of the gallbladder. A small incision was made in the cystic duct. A cholangiocatheter was introduced through the anterior abdominal wall and placed in the cystic duct. A  intraoperative cholangiogram was then performed.  Under real-time fluoroscopy, dilute contrast was injected into the cystic duct.  The common hepatic duct, the right and left hepatic ducts, and the common duct were all visualized. Contrast drained into the duodenum without obvious evidence of any obstructing ductal lesion. The final report is pending the Radiologist's interpretation.  The cholangiocatheter was removed, the cystic duct was clipped 3 times on the biliary side, and then the cystic duct was divided sharply. No bile leak was noted from the cystic duct stump.  The cystic artery was then clipped and divided. Following this the gallbladder was dissected free from the liver using electrocautery. The gallbladder was then placed in a retrieval bag and removed from the abdominal cavity through the subumbilical incision.  The gallbladder fossa was inspected, irrigated, and bleeding was controlled with electrocautery. Inspection showed that hemostasis was adequate and there was no evidence of bile leak.  A piece of Surgicel was placed in the gallbladder fossa  The irrigation fluid was evacuated as much as possible.  The subumbilical trocar was removed and the fascial defect was closed by tightening and tying down the pursestring suture under laparoscopic vision.  The remaining trocars were removed and the pneumoperitoneum was released. The skin incisions were closed with 4-0 Monocryl subcuticular stitches. Steri-Strips and sterile dressings were applied.  The procedure was well-tolerated without any apparent complications. She was taken to the recovery room in satisfactory condition.

## 2013-12-30 NOTE — Anesthesia Postprocedure Evaluation (Signed)
  Anesthesia Post-op Note  Patient: Michaela Rogers  Procedure(s) Performed: Procedure(s) (LRB): LAPAROSCOPIC CHOLECYSTECTOMY WITH INTRAOPERATIVE CHOLANGIOGRAM (N/A)  Patient Location: PACU  Anesthesia Type: General  Level of Consciousness: awake and alert   Airway and Oxygen Therapy: Patient Spontanous Breathing  Post-op Pain: mild  Post-op Assessment: Post-op Vital signs reviewed, Patient's Cardiovascular Status Stable, Respiratory Function Stable, Patent Airway and No signs of Nausea or vomiting  Last Vitals:  Filed Vitals:   12/30/13 1642  BP: 126/68  Pulse: 60  Temp: 36.6 C  Resp: 16    Post-op Vital Signs: stable   Complications: No apparent anesthesia complications

## 2013-12-30 NOTE — Anesthesia Preprocedure Evaluation (Signed)
Anesthesia Evaluation  Patient identified by MRN, date of birth, ID band Patient awake    Reviewed: Allergy & Precautions, H&P , NPO status , Patient's Chart, lab work & pertinent test results  Airway Mallampati: II TM Distance: >3 FB Neck ROM: Full    Dental no notable dental hx.    Pulmonary neg pulmonary ROS,  breath sounds clear to auscultation  Pulmonary exam normal       Cardiovascular negative cardio ROS  Rhythm:Regular Rate:Normal     Neuro/Psych negative neurological ROS  negative psych ROS   GI/Hepatic negative GI ROS, Neg liver ROS,   Endo/Other  negative endocrine ROSHypothyroidism   Renal/GU negative Renal ROS  negative genitourinary   Musculoskeletal negative musculoskeletal ROS (+)   Abdominal   Peds negative pediatric ROS (+)  Hematology negative hematology ROS (+)   Anesthesia Other Findings   Reproductive/Obstetrics negative OB ROS                          Anesthesia Physical Anesthesia Plan  ASA: II  Anesthesia Plan: General   Post-op Pain Management:    Induction: Intravenous  Airway Management Planned: Oral ETT  Additional Equipment:   Intra-op Plan:   Post-operative Plan: Extubation in OR  Informed Consent: I have reviewed the patients History and Physical, chart, labs and discussed the procedure including the risks, benefits and alternatives for the proposed anesthesia with the patient or authorized representative who has indicated his/her understanding and acceptance.   Dental advisory given  Plan Discussed with: CRNA  Anesthesia Plan Comments:         Anesthesia Quick Evaluation  

## 2013-12-30 NOTE — H&P (View-Only) (Signed)
Patient ID: Michaela SenateBrittany Stacey, female   DOB: Jun 12, 1991, 23 y.o.   MRN: [id number]  Chief Complaint  Patient presents with  . New Evaluation    eval GB    HPI Michaela Rogers is a 23 y.o. female.   HPI  She is referred by Dr. Denton LankSteinl from the emergency department.  She was evaluated and had an ultrasound that showed cholelithiasis but no evidence of acute cholecystitis. The common bile duct diameter was normal.  One of her transaminases were slightly elevated. Her white blood cell count was normal. She was treated in a B. Release. She's had a few minor episodes since then. She's been trying to watch her diet. She has lost 40 pounds, purposefully, over the past 6 months. There is a family history of gallbladder disease.  Past Medical History  Diagnosis Date  . Heart murmur   . Thyroid disease     Past Surgical History  Procedure Laterality Date  . Tonsillectomy    . Wisdom tooth extraction    . Shoulder surgery      Family History  Problem Relation Age of Onset  . Diabetes Mother   . Hyperlipidemia Father   . Mental illness Brother   . Hypertension Maternal Grandmother   . Stroke Maternal Grandmother   . Diabetes Paternal Grandmother     Social History History  Substance Use Topics  . Smoking status: Never Smoker   . Smokeless tobacco: Not on file  . Alcohol Use: No     Comment: Social    Allergies  Allergen Reactions  . Codeine Nausea Only    Bad stomach pain    Current Outpatient Prescriptions  Medication Sig Dispense Refill  . levothyroxine (SYNTHROID, LEVOTHROID) 75 MCG tablet Take 75 mcg by mouth daily before breakfast.      . naproxen (NAPROSYN) 500 MG tablet Take 500 mg by mouth 2 (two) times daily as needed (pain).      . Norethin Ace-Eth Estrad-FE (MINASTRIN 24 FE PO) Take 1 tablet by mouth daily.      . promethazine (PHENERGAN) 25 MG tablet Take 1 tablet (25 mg total) by mouth every 6 (six) hours as needed for nausea or vomiting.  12 tablet  0   No  current facility-administered medications for this visit.    Review of Systems Review of Systems  Constitutional: Positive for fever.  Cardiovascular: Positive for chest pain and leg swelling.  Gastrointestinal: Positive for nausea, vomiting, abdominal pain and abdominal distention.  Neurological: Positive for headaches.  Hematological: Bruises/bleeds easily.    Blood pressure 118/78, pulse 74, temperature 98.2 F (36.8 C), temperature source Temporal, resp. rate 16, height 5\' 8"  (1.727 m), weight 240 lb (108.863 kg), last menstrual period 11/10/2013.  Physical Exam Physical Exam  Constitutional: No distress.  Overweight female in no acute distress  HENT:  Head: Normocephalic and atraumatic.  Eyes: EOM are normal. No scleral icterus.  Cardiovascular: Normal rate and regular rhythm.   Pulmonary/Chest: Effort normal and breath sounds normal.  Abdominal: She exhibits no distension and no mass. There is no tenderness.  Musculoskeletal: She exhibits no edema.  Neurological: She is alert.  Skin: Skin is warm and dry.  Psychiatric: She has a normal mood and affect.    Data Reviewed Laboratory reports, ultrasound report, emergency department visit note  Assessment    Symptomatic cholelithiasis. She's been having some smaller episodes which resolved spontaneously.     Plan    I recommend a laparoscopic cholecystectomy. She is in  agreement with this. We discussed also a strict nonfat to very low-fat diet.  I have explained the procedure, risks, and aftercare of cholecystectomy.  Risks include but are not limited to bleeding, infection, wound problems, anesthesia, diarrhea, bile leak, injury to common bile duct/liver/intestine.  She seems to understand and agrees to proceed.         Jim Desanctisodd J Kayliee Atienza 12/08/2013, 9:56 AM

## 2013-12-30 NOTE — Discharge Instructions (Signed)
[facility] ______[facility] LAPAROSCOPIC SURGERY: POST OP INSTRUCTIONS Always review your discharge instruction sheet given to you by the facility where your surgery was performed. IF YOU HAVE DISABILITY OR FAMILY LEAVE FORMS, YOU MUST BRING THEM TO THE OFFICE FOR PROCESSING.   DO NOT GIVE THEM TO YOUR DOCTOR.  1. A prescription for pain medication may be given to you upon discharge.  Take your pain medication as prescribed, if needed.  If narcotic pain medicine is not needed, then you may take acetaminophen (Tylenol) or ibuprofen (Advil) as needed. 2. Take your usually prescribed medications unless otherwise directed. 3. If you need a refill on your pain medication, please contact your pharmacy.  They will contact our office to request authorization. Prescriptions will not be filled after 5pm or on week-ends. 4. You should follow a light diet the first few days after arrival home, such as soup and crackers, etc.  Be sure to include lots of fluids daily. 5. Most patients will experience some swelling and bruising in the area of the incisions.  Ice packs will help.  Swelling and bruising can take several days to resolve.  6. It is common to experience some constipation if taking pain medication after surgery.  Increasing fluid intake and taking a stool softener (such as Colace) will usually help or prevent this problem from occurring.  A mild laxative (Milk of Magnesia or Miralax) should be taken according to package instructions if there are no bowel movements after 48 hours. 7. Unless discharge instructions indicate otherwise, you may remove your bandages 72 hours after surgery, and you may shower at that time.  You may have steri-strips (small skin tapes) in place directly over the incision.  These strips should be left on the skin for 14 days.  If your surgeon used skin glue on the incision, you may shower in 24 hours.  The glue will flake off over the next 2-3 weeks.  Any sutures or  staples will be removed at the office during your follow-up visit. 8. ACTIVITIES:  You may resume regular (light) daily activities beginning the next day--such as daily self-care, walking, climbing stairs--gradually increasing activities as tolerated.  You may have sexual intercourse when it is comfortable.  Refrain from any heavy lifting or straining (nothing over 10 pounds for 2 weeks). a. You may drive when you are no longer taking prescription pain medication, you can comfortably wear a seatbelt, and you can safely maneuver your car and apply brakes. b. RETURN TO WORK:  _Light work in one week, fully duty in 2 weeks._________________________________________________________ 9. You should see your doctor in the office for a follow-up appointment approximately 2-3 weeks after your surgery.  Make sure that you call for this appointment within a day or two after you arrive home to insure a convenient appointment time. 10. OTHER INSTRUCTIONS: __________________________________________________________________________________________________________________________ __________________________________________________________________________________________________________________________ WHEN TO CALL YOUR DOCTOR: 1. Fever over 101.0 2. Inability to urinate 3. Continued bleeding from incision. 4. Increased pain, redness, or drainage from the incision. 5. Increasing abdominal pain  The clinic staff is available to answer your questions during regular business hours.  Please dont hesitate to call and ask to speak to one of the nurses for clinical concerns.  If you have a medical emergency, go to the nearest emergency room or call 911.  A surgeon from [facility] is always on call at the hospital. [address], [address], [place], [place]  [address] ? [address] [address], [place], [place]  [address] [phone] ? [phone] ? [phone] [phone] Web site: [email or web address]

## 2013-12-30 NOTE — Transfer of Care (Signed)
Immediate Anesthesia Transfer of Care Note  Patient: Michaela Rogers  Procedure(s) Performed: Procedure(s): LAPAROSCOPIC CHOLECYSTECTOMY WITH INTRAOPERATIVE CHOLANGIOGRAM (N/A)  Patient Location: PACU  Anesthesia Type:General  Level of Consciousness: awake, alert  and oriented  Airway & Oxygen Therapy: Patient Spontanous Breathing and Patient connected to face mask oxygen  Post-op Assessment: Report given to PACU RN and Post -op Vital signs reviewed and stable  Post vital signs: Reviewed and stable  Complications: No apparent anesthesia complications

## 2013-12-31 ENCOUNTER — Encounter (HOSPITAL_COMMUNITY): Payer: Self-pay | Admitting: General Surgery

## 2013-12-31 ENCOUNTER — Telehealth (INDEPENDENT_AMBULATORY_CARE_PROVIDER_SITE_OTHER): Payer: Self-pay

## 2013-12-31 NOTE — Telephone Encounter (Signed)
Able to reach the patient and explained that her pain was most likely trapped gas after surgery.  She has been moving around some and is passing gas.  She has used a heating pad on her shoulders which she says has helped.  She understands to call us in the next 48 hours or so if the pain persists.

## 2013-12-31 NOTE — Telephone Encounter (Signed)
Probably trapped gas and should get better in a few days.

## 2013-12-31 NOTE — Telephone Encounter (Signed)
Unable to reach pt's mother by telephone.

## 2013-12-31 NOTE — Telephone Encounter (Signed)
Mother states daughter shoulder is hurting and she has had problems with the shoulder in the past and she feels there could be some gas form having surgery trapped in the shoulder. Patient has not had a bm since surgery yesterday. Advised for patient to increase her activity,fluids,green leafy vegs , she may need to call her orthopedic since patient has hx of shoulder problems .

## 2015-06-27 IMAGING — US US ABDOMEN COMPLETE
1 series · 14 of 25 positions shown · non-contrast
Comparison: None.

CLINICAL DATA: Abdominal pain and fever.

EXAM:
ULTRASOUND ABDOMEN COMPLETE

[Series 1: us abdomen complete · 0.24mm/px · 14 of 85 slices shown]
[im 1/85]
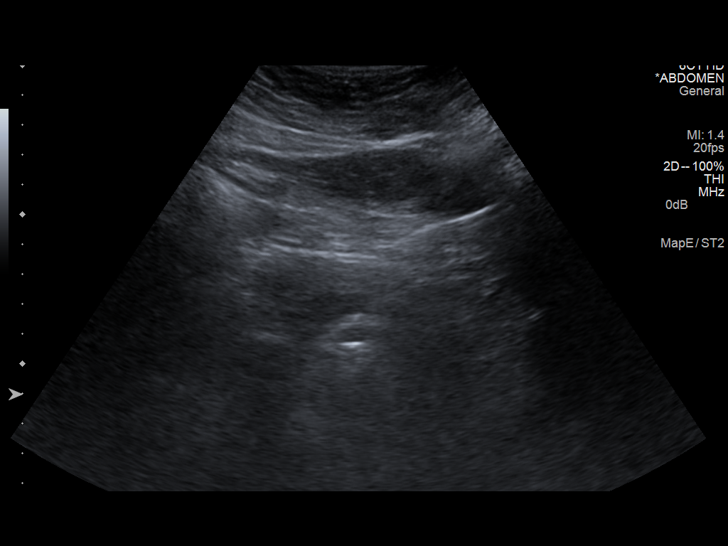
[im 8/85]
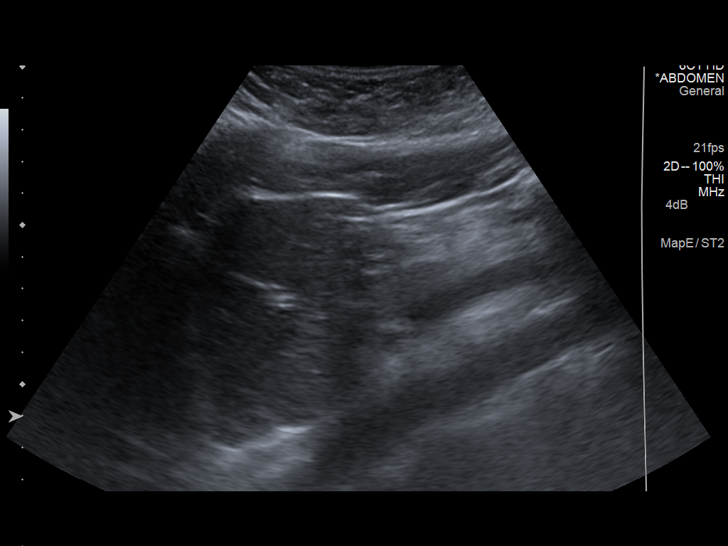
[im 15/85]
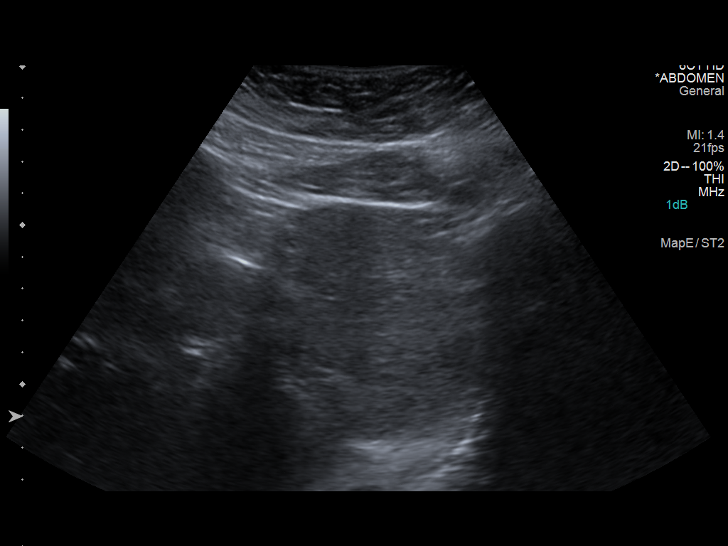
[im 22/85]
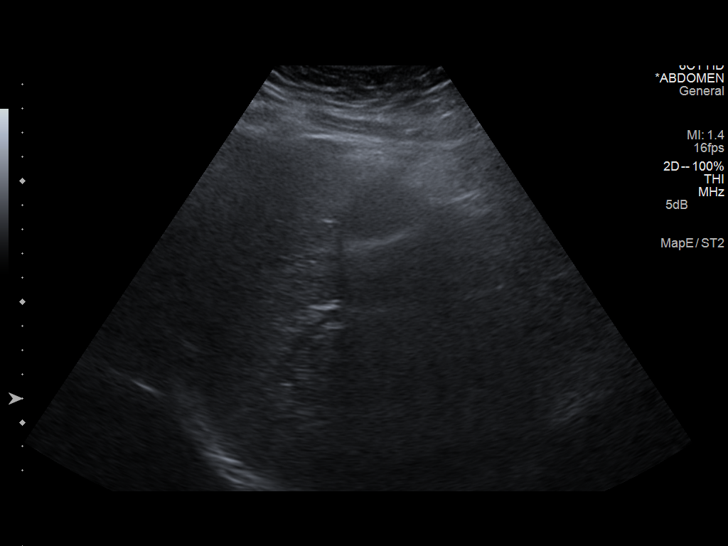
[im 29/85]
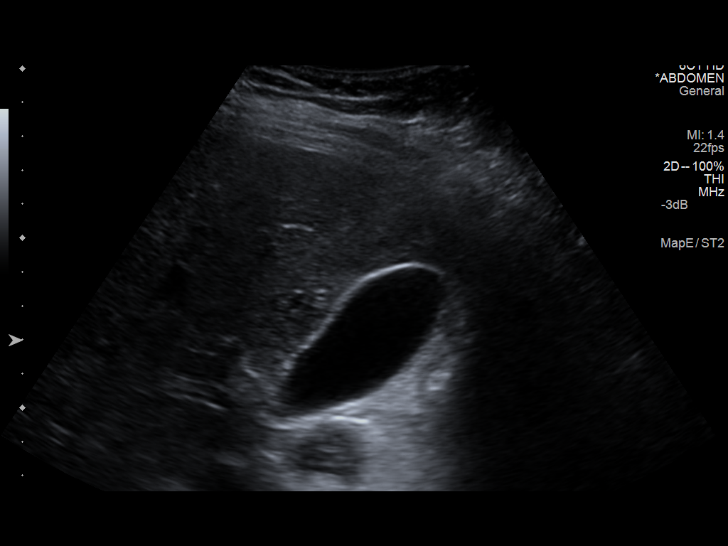
[im 32/85]
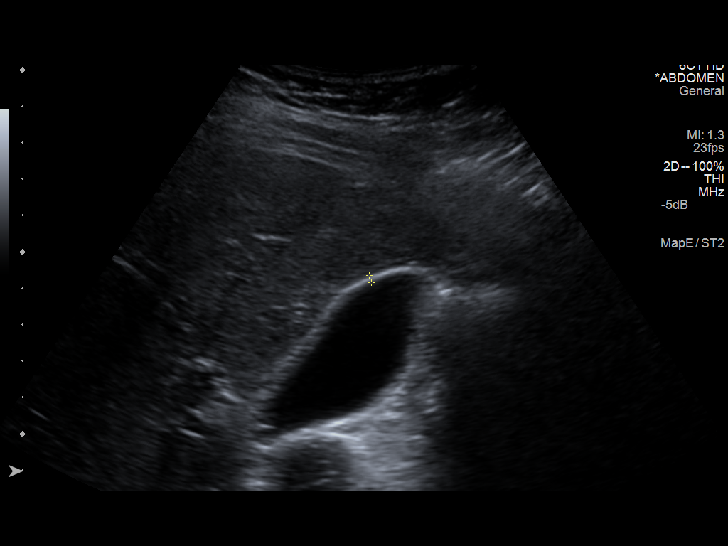
[im 39/85]
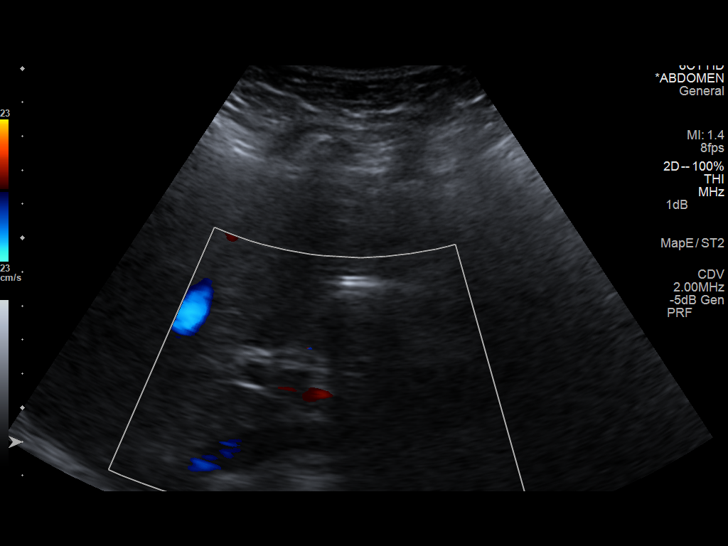
[im 46/85]
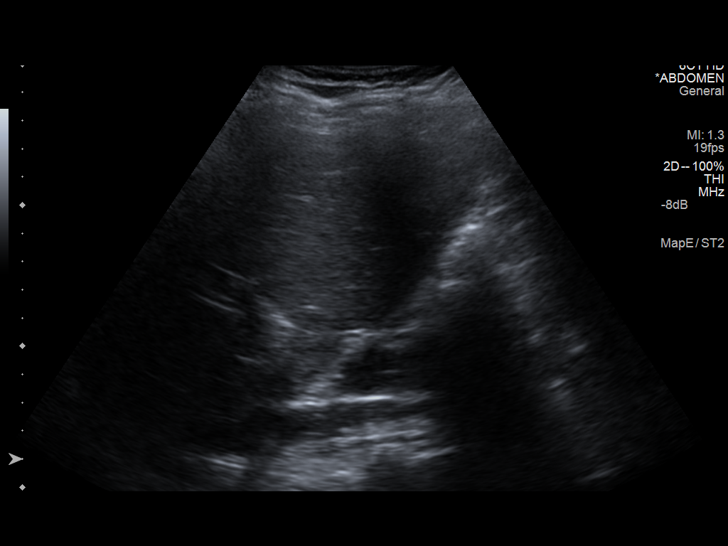
[im 53/85]
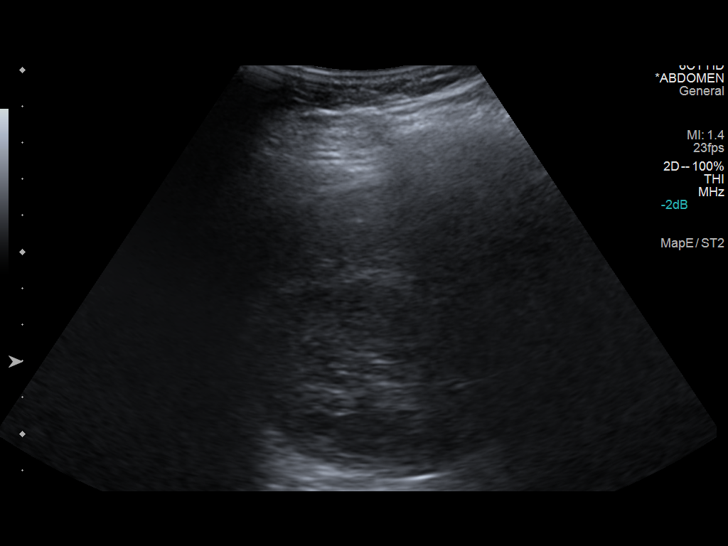
[im 57/85]
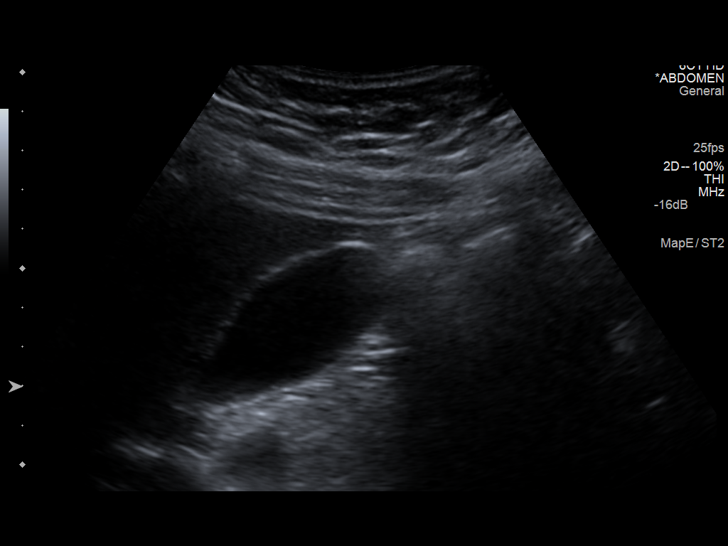
[im 64/85]
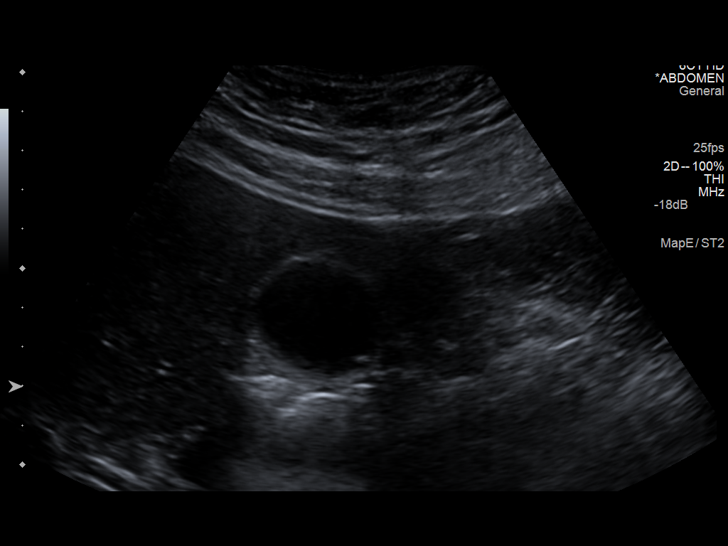
[im 71/85]
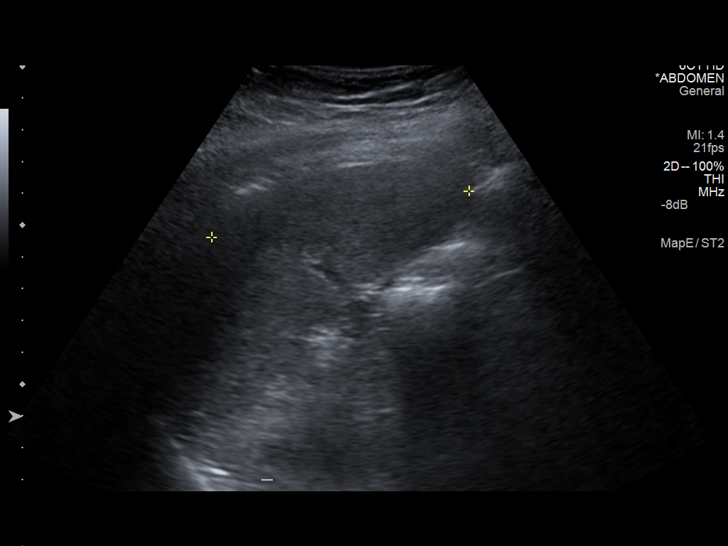
[im 78/85]
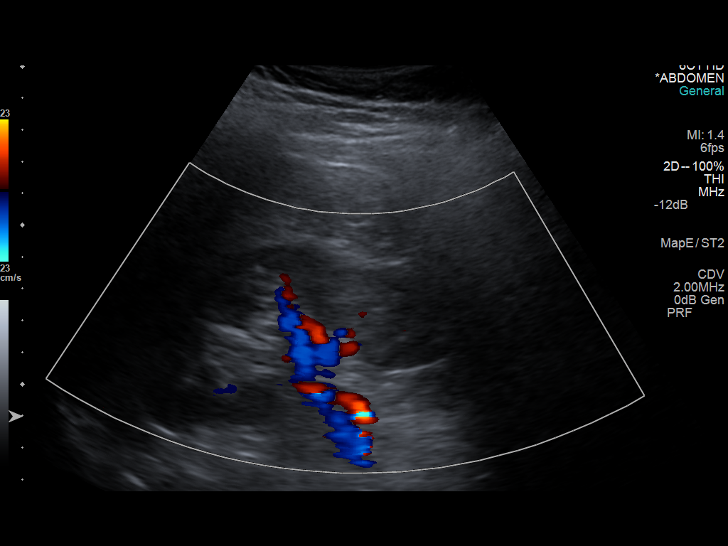
[im 85/85]
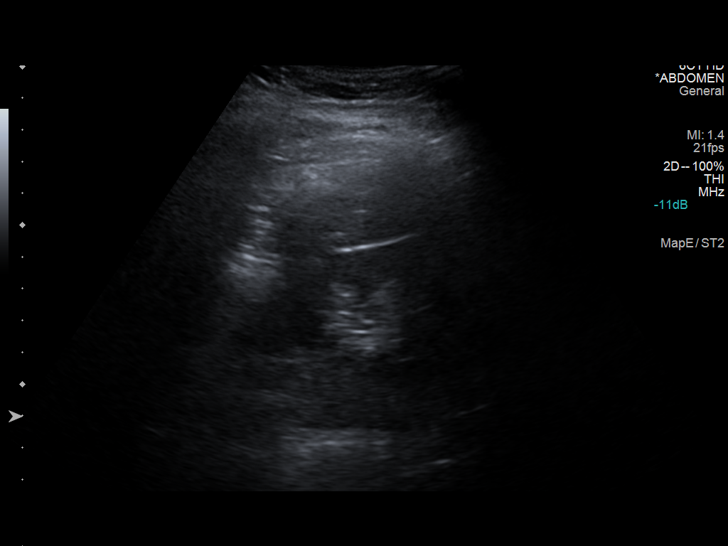

[14 of 25 positions shown; findings below may reference images not displayed]

FINDINGS: Gallbladder:

A small 5 mm stone is noted dependently within the gallbladder. The
gallbladder is otherwise unremarkable in appearance. No
pericholecystic fluid or gallbladder wall thickening is seen. No
ultrasonographic Murphy's sign is elicited.

Common bile duct:

Diameter: 0.2 cm within normal limits in caliber.

Liver:

No focal lesion identified. Within normal limits in parenchymal
echogenicity.

IVC:

No abnormality visualized.

Pancreas:

Visualized portion unremarkable.

Spleen:

Size and appearance within normal limits.

Right Kidney:

Length: 10.8 cm. Echogenicity within normal limits. No mass or
hydronephrosis visualized.

Left Kidney:

Length: 10.7 cm. Echogenicity within normal limits. No mass or
hydronephrosis visualized.

Abdominal aorta:

No aneurysm visualized.

Other findings:

None.
IMPRESSION: 1. No acute abnormality seen within the abdomen.
2. Cholelithiasis incidentally noted; gallbladder otherwise
unremarkable.

## 2015-08-08 IMAGING — RF DG CHOLANGIOGRAM OPERATIVE
1 series · 4 of 4 positions shown · non-contrast
Comparison: None.

CLINICAL DATA: gallstones

EXAM:
INTRAOPERATIVE CHOLANGIOGRAM
TECHNIQUE: Cholangiographic images from the C-arm fluoroscopic device were
submitted for interpretation post-operatively. Please see the
procedural report for the amount of contrast and the fluoroscopy
time utilized.

[Series 1: run · 4 of 27 frames shown]
[frame 5/27]
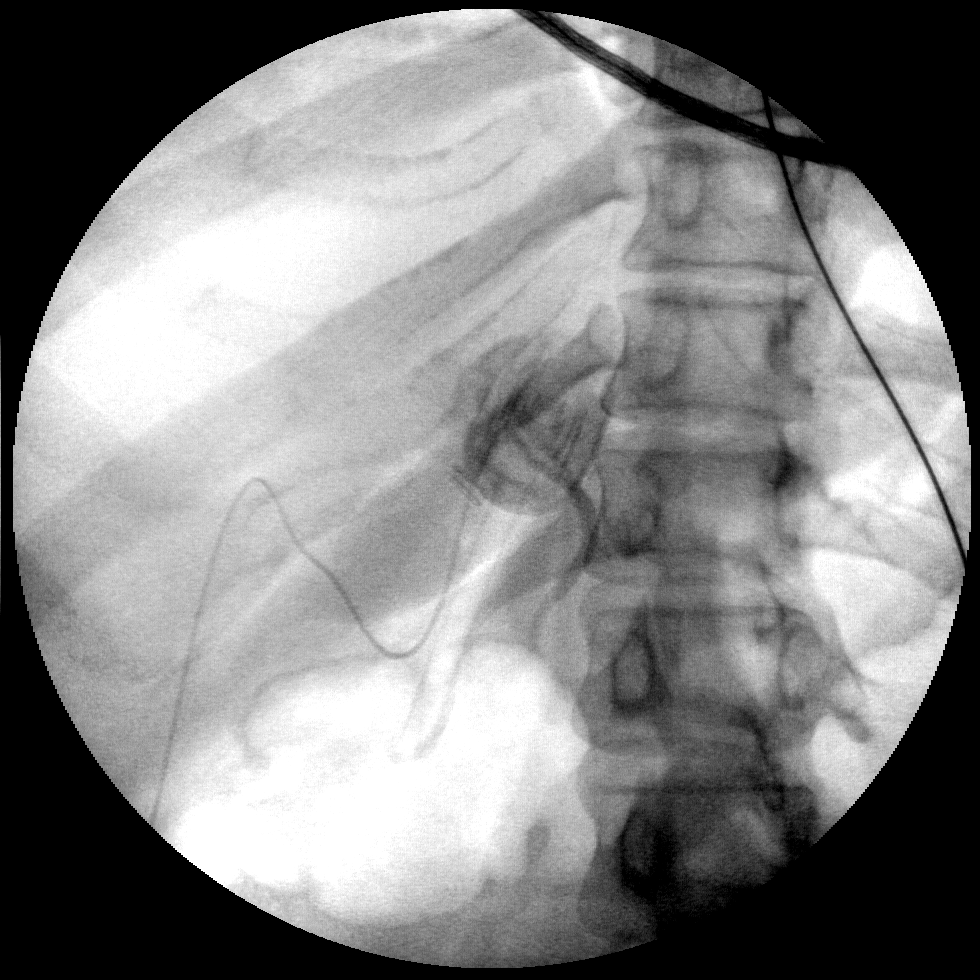
[frame 14/27]
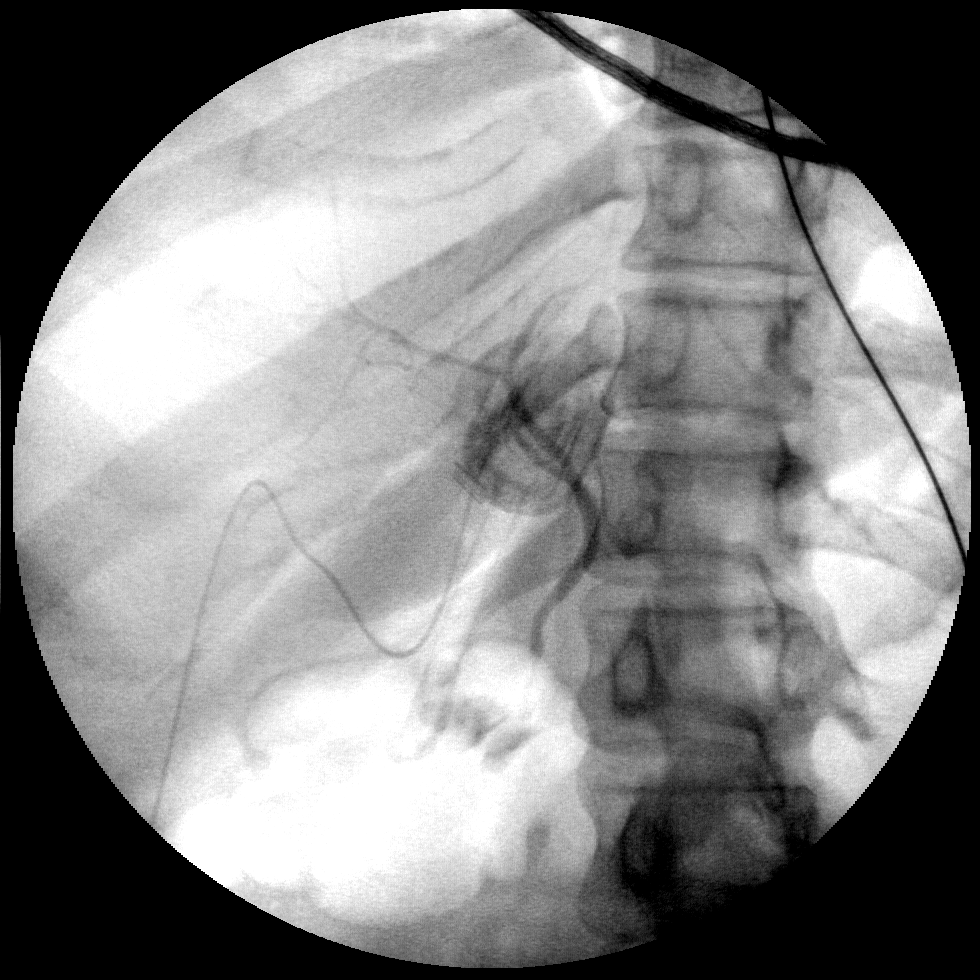
[frame 23/27]
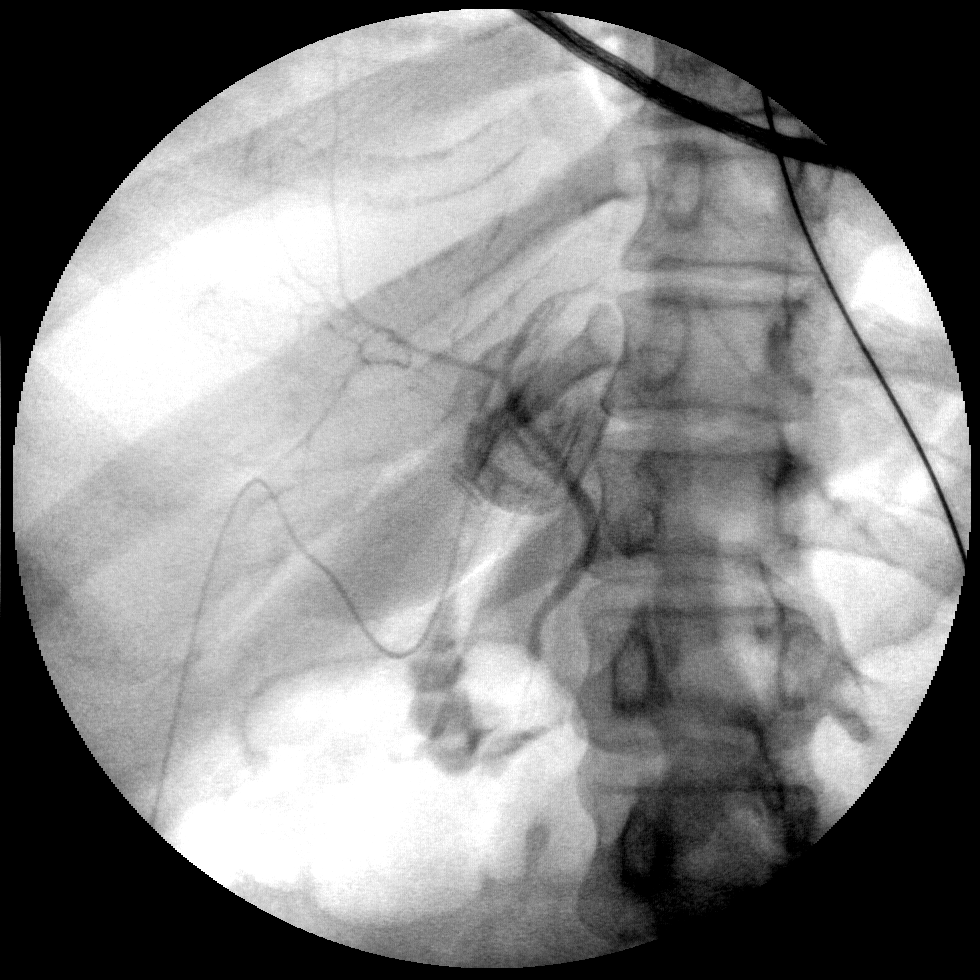
[frame 24/27]
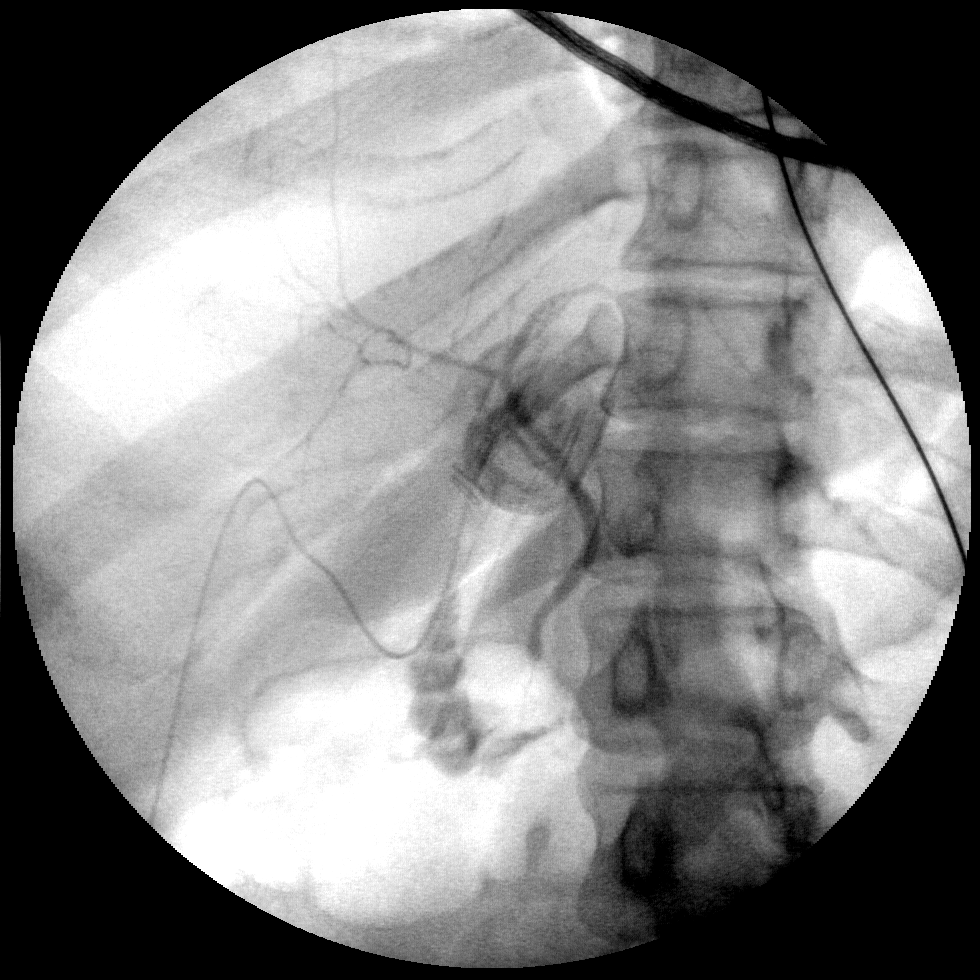

[4 of 4 positions shown; findings below may reference images not displayed]

FINDINGS: The cystic duct has been cannulated. Contrast is appreciated filling
a nondilated common bile duct and nondilated intrahepatic biliary
ductal radicles. There is no evidence of filling defects. Contrast
empties into the small bowel. There is no evidence of contrast
extravasation.
IMPRESSION: Intraoperative cholangiogram.
